# Patient Record
Sex: Male | Born: 1960 | Race: White | Hispanic: No | Marital: Married | Smoking: Former smoker
Health system: Southern US, Community
[De-identification: ages and names within clinical notes are randomized; demographics above are authoritative.]

## PROBLEM LIST (undated history)

## (undated) DIAGNOSIS — K219 Gastro-esophageal reflux disease without esophagitis: Secondary | ICD-10-CM

## (undated) DIAGNOSIS — Z974 Presence of external hearing-aid: Secondary | ICD-10-CM

## (undated) HISTORY — DX: Presence of external hearing-aid: Z97.4

## (undated) HISTORY — PX: ACHILLES TENDON REPAIR: SUR1153

## (undated) HISTORY — DX: Gastro-esophageal reflux disease without esophagitis: K21.9

---

## 1996-02-04 HISTORY — PX: LIPOMA EXCISION: SHX5283

## 2013-02-03 HISTORY — PX: COLONOSCOPY: SHX174

## 2014-03-09 ENCOUNTER — Ambulatory Visit: Payer: Self-pay

## 2014-08-22 ENCOUNTER — Other Ambulatory Visit: Payer: Self-pay | Admitting: Otolaryngology

## 2014-08-22 DIAGNOSIS — H8092 Unspecified otosclerosis, left ear: Secondary | ICD-10-CM

## 2014-08-22 DIAGNOSIS — H905 Unspecified sensorineural hearing loss: Secondary | ICD-10-CM

## 2014-08-25 ENCOUNTER — Ambulatory Visit
Admission: RE | Admit: 2014-08-25 | Discharge: 2014-08-25 | Disposition: A | Discharge status: Home / Self Care | Payer: 59 | Source: Ambulatory Visit | Attending: Otolaryngology | Admitting: Otolaryngology

## 2014-08-25 DIAGNOSIS — H9192 Unspecified hearing loss, left ear: Secondary | ICD-10-CM | POA: Diagnosis present

## 2014-08-25 DIAGNOSIS — H8092 Unspecified otosclerosis, left ear: Secondary | ICD-10-CM

## 2014-08-25 DIAGNOSIS — H905 Unspecified sensorineural hearing loss: Secondary | ICD-10-CM

## 2014-08-25 MED ORDER — GADOBENATE DIMEGLUMINE 529 MG/ML IV SOLN
20.0000 mL | Freq: Once | INTRAVENOUS | Status: AC | PRN
  Administered 2014-08-25: 18 mL via INTRAVENOUS

## 2016-06-03 DIAGNOSIS — M5416 Radiculopathy, lumbar region: Secondary | ICD-10-CM | POA: Insufficient documentation

## 2016-11-26 ENCOUNTER — Encounter: Payer: Self-pay | Admitting: *Deleted

## 2016-12-04 ENCOUNTER — Encounter: Payer: Self-pay | Admitting: *Deleted

## 2016-12-09 ENCOUNTER — Ambulatory Visit: Payer: Managed Care, Other (non HMO) | Admitting: General Surgery

## 2016-12-09 ENCOUNTER — Encounter: Payer: Self-pay | Admitting: General Surgery

## 2016-12-09 VITALS — BP 134/88 | HR 78 | Resp 12 | Ht 68.0 in | Wt 185.0 lb

## 2016-12-09 DIAGNOSIS — D179 Benign lipomatous neoplasm, unspecified: Secondary | ICD-10-CM | POA: Diagnosis not present

## 2016-12-09 NOTE — Patient Instructions (Signed)
Lipoma A lipoma is a noncancerous (benign) tumor that is made up of fat cells. This is a very common type of soft-tissue growth. Lipomas are usually found under the skin (subcutaneous). They may occur in any tissue of the body that contains fat. Common areas for lipomas to appear include the back, shoulders, buttocks, and thighs. Lipomas grow slowly, and they are usually painless. Most lipomas do not cause problems and do not require treatment. What are the causes? The cause of this condition is not known. What increases the risk? This condition is more likely to develop in:  People who are 40-60 years old.  People who have a family history of lipomas.  What are the signs or symptoms? A lipoma usually appears as a small, round bump under the skin. It may feel soft or rubbery, but the firmness can vary. Most lipomas are not painful. However, a lipoma may become painful if it is located in an area where it pushes on nerves. How is this diagnosed? A lipoma can usually be diagnosed with a physical exam. You may also have tests to confirm the diagnosis and to rule out other conditions. Tests may include:  Imaging tests, such as a CT scan or MRI.  Removal of a tissue sample to be looked at under a microscope (biopsy).  How is this treated? Treatment is not needed for small lipomas that are not causing problems. If a lipoma continues to get bigger or it causes problems, removal is often the best option. Lipomas can also be removed to improve appearance. Removal of a lipoma is usually done with a surgery in which the fatty cells and the surrounding capsule are removed. Most often, a medicine that numbs the area (local anesthetic) is used for this procedure. Follow these instructions at home:  Keep all follow-up visits as directed by your health care provider. This is important. Contact a health care provider if:  Your lipoma becomes larger or hard.  Your lipoma becomes painful, red, or  increasingly swollen. These could be signs of infection or a more serious condition. This information is not intended to replace advice given to you by your health care provider. Make sure you discuss any questions you have with your health care provider. Document Released: 01/10/2002 Document Revised: 06/28/2015 Document Reviewed: 01/16/2014 Elsevier Interactive Patient Education  2018 Elsevier Inc.  

## 2016-12-09 NOTE — Progress Notes (Signed)
Patient ID: Jason Bowers, male   DOB: 12/29/1960, 56 y.o.   MRN: [id number]  Chief Complaint  Patient presents with  . Lipoma    HPI Jason Bowers is a 56 y.o. male here today for a evaualtion of lipomas. Patient noticed this area about 10 year now. He states if he hits the ones on his arm there is some pain. He states he had about 20 of them removed before.  The patient describes having a surgical procedure over 30 lipomas were removed when he was in his 66s. He reported being covered from head to toe with Tegaderm dressings. He has multiple lipomas, but only some that are symptomatic. HPI  Past Medical History:  Diagnosis Date  . GERD (gastroesophageal reflux disease)   . Wears hearing aid     Past Surgical History:  Procedure Laterality Date  . COLONOSCOPY  2015  . LIPOMA EXCISION  1998    No family history on file.  Social History Social History   Tobacco Use  . Smoking status: Former Smoker    Years: 18.00    Types: Cigarettes  . Smokeless tobacco: Never Used  Substance Use Topics  . Alcohol use: Not on file  . Drug use: No    No Known Allergies  Current Outpatient Medications  Medication Sig Dispense Refill  . acetaminophen (TYLENOL) 325 MG tablet Take 650 mg every 6 (six) hours as needed by mouth.    . gabapentin (NEURONTIN) 300 MG capsule   0  . omeprazole (PRILOSEC OTC) 20 MG tablet Take by mouth.     No current facility-administered medications for this visit.     Review of Systems Review of Systems  Constitutional: Negative.   Respiratory: Negative.   Cardiovascular: Negative.     Blood pressure 134/88, pulse 78, resp. rate 12, height 5\' 8"  (1.727 m), weight 185 lb (83.9 kg).  Physical Exam Physical Exam  Constitutional: He is oriented to person, place, and time. He appears well-developed and well-nourished.  Cardiovascular: Normal rate, regular rhythm and normal heart sounds.  Pulmonary/Chest: Effort normal and breath sounds normal.   Musculoskeletal:       Arms: Neurological: He is alert and oriented to person, place, and time.  Skin: Skin is warm and dry.         Assessment    Multiple lipomas.    Plan    Options for management including local anesthesia versus sedation in the hol reviewed.    Patient to return for multiple lipoma excision.  The patient is aware to call back for any questions or concerns.   HPI, Physical Exam, Assessment and Plan have been scribed under the direction and in the presence of Hervey Ard, MD.  Gaspar Cola, CMA  I have completed the exam and reviewed the above documentation for accuracy and completeness.  I agree with the above.  Haematologist has been used and any errors in dictation or transcription are unintentional.  Hervey Ard, M.D., F.A.C.S.   Jason Bowers 12/10/2016, 7:00 PM

## 2016-12-10 DIAGNOSIS — D179 Benign lipomatous neoplasm, unspecified: Secondary | ICD-10-CM

## 2016-12-10 HISTORY — DX: Benign lipomatous neoplasm, unspecified: D17.9

## 2016-12-29 ENCOUNTER — Encounter: Payer: Self-pay | Admitting: General Surgery

## 2016-12-29 ENCOUNTER — Ambulatory Visit: Payer: Managed Care, Other (non HMO) | Admitting: General Surgery

## 2016-12-29 VITALS — BP 124/78 | HR 87 | Resp 12 | Ht 67.0 in | Wt 185.0 lb

## 2016-12-29 DIAGNOSIS — D179 Benign lipomatous neoplasm, unspecified: Secondary | ICD-10-CM

## 2016-12-29 DIAGNOSIS — D2112 Benign neoplasm of connective and other soft tissue of left upper limb, including shoulder: Secondary | ICD-10-CM | POA: Diagnosis not present

## 2016-12-29 DIAGNOSIS — D2111 Benign neoplasm of connective and other soft tissue of right upper limb, including shoulder: Secondary | ICD-10-CM | POA: Diagnosis not present

## 2016-12-29 DIAGNOSIS — D214 Benign neoplasm of connective and other soft tissue of abdomen: Secondary | ICD-10-CM | POA: Diagnosis not present

## 2016-12-29 DIAGNOSIS — D213 Benign neoplasm of connective and other soft tissue of thorax: Secondary | ICD-10-CM | POA: Diagnosis not present

## 2016-12-29 NOTE — Patient Instructions (Addendum)
You may shower. You can remove the waterproof dressings in 3 days. Leave the steri strips in place they will fall of in one week.  Follow up in one week with the nurse.  We will call you with the results You may use over the counter pain medication for any discomfort. Use ice to any areas that are very sore or tender.

## 2016-12-29 NOTE — Progress Notes (Signed)
Patient ID: Jason Bowers, male   DOB: 09/14/1960, 56 y.o.   MRN: [id number]  Chief Complaint  Patient presents with  . Procedure    HPI Jason Bowers is a 56 y.o. male here for removal of multiple lipomas from his arms and sternal area and peri-umbilical area.    HPI  Past Medical History:  Diagnosis Date  . GERD (gastroesophageal reflux disease)   . Wears hearing aid     Past Surgical History:  Procedure Laterality Date  . COLONOSCOPY  2015  . LIPOMA EXCISION  1998    No family history on file.  Social History Social History   Tobacco Use  . Smoking status: Former Smoker    Years: 18.00    Types: Cigarettes  . Smokeless tobacco: Never Used  Substance Use Topics  . Alcohol use: Not on file  . Drug use: No    No Known Allergies  Current Outpatient Medications  Medication Sig Dispense Refill  . acetaminophen (TYLENOL) 325 MG tablet Take 650 mg every 6 (six) hours as needed by mouth.    . gabapentin (NEURONTIN) 300 MG capsule   0  . omeprazole (PRILOSEC OTC) 20 MG tablet Take by mouth.     No current facility-administered medications for this visit.     Review of Systems Review of Systems  Blood pressure 124/78, pulse 87, resp. rate 12, height 5\' 7"  (1.702 m), weight 185 lb (83.9 kg).  Physical Exam Physical Exam  Skin:        Assessment    Multiple soft tissue masses consistent with lipomas.    Plan  The sites were cleansed with ChloraPrep. A total of 31 mL of 0.5% Xylocaine with 0.25% Marcaine with 1-200,000 epinephrine was utilized well tolerated. Beginning at the umbilicus the 6 cm lesion was excised through a transverse incision.A multilobulated lipoma was noted.a deep layer of running 3-0 Vicryl suture was used to help obliterate the dead space followed by a running 3-0 Vicryl subcuticular suture.  The sternal lesion was approached next through a transverse incision. A similar, 67 m multilobulated mass was excised. Layered closure with 3-0  Vicryl to the adipose layer and a running 3-0 Vicryl subcuticular suture for the skin.  The right upper arm lesion was approached through a longitudinal incision. This was closed primarily with a running 3-0 Vicryl subcuticular suture.   The left upper arm and left forearm lesions were resected through longitudinal incisions. Primary closure with running 3-Vicryl for both sites. These 2 lesions in the same vial. All lesions appeared to represent simple lipomas.  Steri-Strips, Telfa and Tegaderm dressings applied.  Patient reported no need for narcotic analgesics.  Nursing follow-up in one week.    We will call the patient with the results. To follow up with the nurse in one week.        Robert Bellow 12/29/2016, 9:28 PM

## 2016-12-30 ENCOUNTER — Ambulatory Visit (INDEPENDENT_AMBULATORY_CARE_PROVIDER_SITE_OTHER): Payer: Managed Care, Other (non HMO) | Admitting: *Deleted

## 2016-12-30 DIAGNOSIS — D179 Benign lipomatous neoplasm, unspecified: Secondary | ICD-10-CM

## 2016-12-30 NOTE — Progress Notes (Signed)
Patient came in today for a wound check.  Changed bandages, no signs of infection noted. Follow up as scheduled.

## 2017-01-05 ENCOUNTER — Ambulatory Visit: Payer: Managed Care, Other (non HMO)

## 2017-01-05 DIAGNOSIS — D179 Benign lipomatous neoplasm, unspecified: Secondary | ICD-10-CM

## 2017-01-05 NOTE — Progress Notes (Signed)
Patient ID: Jason Bowers, male   DOB: [birth year], 56 y.o.   MRN: [id number] Patient came in today for a wound check.  The wound is clean, with no signs of infection noted. Follow up as needed.

## 2017-01-06 ENCOUNTER — Telehealth: Payer: Self-pay | Admitting: *Deleted

## 2017-01-06 NOTE — Telephone Encounter (Signed)
Notified patient as instructed, patient agrees. Pathology also sent to patients PCP

## 2017-01-06 NOTE — Telephone Encounter (Signed)
-----   Message from Robert Bellow, MD sent at 01/06/2017  9:12 AM EST ----- Please notify the patient all tissue was examined and was just fat as expected.  ----- Message ----- From: Interface, Lab In [facility] Sent: 01/01/2017   5:57 PM To: Robert Bellow, MD

## 2018-06-16 ENCOUNTER — Other Ambulatory Visit: Payer: Self-pay | Admitting: Family Medicine

## 2018-06-16 DIAGNOSIS — G8929 Other chronic pain: Secondary | ICD-10-CM

## 2018-06-29 ENCOUNTER — Other Ambulatory Visit: Payer: Self-pay

## 2018-06-29 ENCOUNTER — Ambulatory Visit
Admission: RE | Admit: 2018-06-29 | Discharge: 2018-06-29 | Disposition: A | Discharge status: Home / Self Care | Payer: BLUE CROSS/BLUE SHIELD | Source: Ambulatory Visit | Attending: Family Medicine | Admitting: Family Medicine

## 2018-06-29 DIAGNOSIS — G8929 Other chronic pain: Secondary | ICD-10-CM | POA: Insufficient documentation

## 2018-06-29 DIAGNOSIS — M5442 Lumbago with sciatica, left side: Secondary | ICD-10-CM | POA: Diagnosis present

## 2018-08-03 DIAGNOSIS — E7800 Pure hypercholesterolemia, unspecified: Secondary | ICD-10-CM

## 2018-08-03 HISTORY — DX: Pure hypercholesterolemia, unspecified: E78.00

## 2018-12-16 ENCOUNTER — Other Ambulatory Visit
Admission: RE | Admit: 2018-12-16 | Payer: BLUE CROSS/BLUE SHIELD | Source: Ambulatory Visit | Attending: Gastroenterology | Admitting: Gastroenterology

## 2018-12-20 ENCOUNTER — Ambulatory Visit
Admission: RE | Admit: 2018-12-20 | Payer: BLUE CROSS/BLUE SHIELD | Source: Home / Self Care | Admitting: Internal Medicine

## 2018-12-20 ENCOUNTER — Encounter: Admission: RE | Payer: Self-pay | Source: Home / Self Care

## 2018-12-20 SURGERY — COLONOSCOPY WITH PROPOFOL
Anesthesia: General

## 2019-05-26 ENCOUNTER — Other Ambulatory Visit: Payer: Self-pay | Admitting: Family Medicine

## 2019-05-26 DIAGNOSIS — M25552 Pain in left hip: Secondary | ICD-10-CM

## 2019-05-26 DIAGNOSIS — M79605 Pain in left leg: Secondary | ICD-10-CM

## 2019-05-26 DIAGNOSIS — M5417 Radiculopathy, lumbosacral region: Secondary | ICD-10-CM

## 2019-05-26 DIAGNOSIS — R202 Paresthesia of skin: Secondary | ICD-10-CM

## 2019-05-26 DIAGNOSIS — M533 Sacrococcygeal disorders, not elsewhere classified: Secondary | ICD-10-CM

## 2019-06-01 ENCOUNTER — Ambulatory Visit
Admission: RE | Admit: 2019-06-01 | Discharge: 2019-06-01 | Disposition: A | Discharge status: Home / Self Care | Payer: BLUE CROSS/BLUE SHIELD | Source: Ambulatory Visit | Attending: Family Medicine | Admitting: Family Medicine

## 2019-06-01 ENCOUNTER — Other Ambulatory Visit: Payer: Self-pay

## 2019-06-01 DIAGNOSIS — M25552 Pain in left hip: Secondary | ICD-10-CM | POA: Insufficient documentation

## 2019-06-01 DIAGNOSIS — R202 Paresthesia of skin: Secondary | ICD-10-CM | POA: Diagnosis present

## 2019-06-01 DIAGNOSIS — M79605 Pain in left leg: Secondary | ICD-10-CM

## 2019-06-01 DIAGNOSIS — M533 Sacrococcygeal disorders, not elsewhere classified: Secondary | ICD-10-CM

## 2019-06-01 DIAGNOSIS — M5417 Radiculopathy, lumbosacral region: Secondary | ICD-10-CM

## 2019-06-08 ENCOUNTER — Other Ambulatory Visit: Payer: BLUE CROSS/BLUE SHIELD

## 2019-06-08 ENCOUNTER — Ambulatory Visit: Payer: BLUE CROSS/BLUE SHIELD

## 2020-01-09 ENCOUNTER — Other Ambulatory Visit: Payer: Self-pay | Admitting: Family Medicine

## 2020-01-09 DIAGNOSIS — J3489 Other specified disorders of nose and nasal sinuses: Secondary | ICD-10-CM

## 2020-01-17 ENCOUNTER — Other Ambulatory Visit: Payer: Self-pay

## 2020-01-17 ENCOUNTER — Ambulatory Visit
Admission: RE | Admit: 2020-01-17 | Discharge: 2020-01-17 | Disposition: A | Discharge status: Home / Self Care | Payer: BLUE CROSS/BLUE SHIELD | Source: Ambulatory Visit | Attending: Family Medicine | Admitting: Family Medicine

## 2020-01-17 DIAGNOSIS — J3489 Other specified disorders of nose and nasal sinuses: Secondary | ICD-10-CM | POA: Insufficient documentation

## 2020-07-03 ENCOUNTER — Ambulatory Visit (INDEPENDENT_AMBULATORY_CARE_PROVIDER_SITE_OTHER): Payer: BLUE CROSS/BLUE SHIELD | Admitting: Urology

## 2020-07-03 ENCOUNTER — Encounter: Payer: Self-pay | Admitting: Urology

## 2020-07-03 ENCOUNTER — Other Ambulatory Visit: Payer: Self-pay

## 2020-07-03 VITALS — BP 145/86 | HR 86 | Ht 67.0 in | Wt 186.0 lb

## 2020-07-03 DIAGNOSIS — N401 Enlarged prostate with lower urinary tract symptoms: Secondary | ICD-10-CM

## 2020-07-03 DIAGNOSIS — R339 Retention of urine, unspecified: Secondary | ICD-10-CM | POA: Diagnosis not present

## 2020-07-03 DIAGNOSIS — Z125 Encounter for screening for malignant neoplasm of prostate: Secondary | ICD-10-CM

## 2020-07-03 DIAGNOSIS — R3912 Poor urinary stream: Secondary | ICD-10-CM | POA: Diagnosis not present

## 2020-07-03 DIAGNOSIS — Z8619 Personal history of other infectious and parasitic diseases: Secondary | ICD-10-CM

## 2020-07-03 DIAGNOSIS — N400 Enlarged prostate without lower urinary tract symptoms: Secondary | ICD-10-CM

## 2020-07-03 DIAGNOSIS — K219 Gastro-esophageal reflux disease without esophagitis: Secondary | ICD-10-CM | POA: Insufficient documentation

## 2020-07-03 HISTORY — DX: Personal history of other infectious and parasitic diseases: Z86.19

## 2020-07-03 LAB — BLADDER SCAN AMB NON-IMAGING

## 2020-07-03 NOTE — Progress Notes (Signed)
07/03/2020 3:37 PM   Jason Bowers 10/08/1960 [id number]  Referring provider: Dion Body, MD [facility] [place],  [place] [address]  Chief Complaint  Patient presents with  . Benign Prostatic Hypertrophy    New Patient    HPI: 60 year old male with progressive urinary symptoms over the past several years who presents today for further evaluation of this.  He primarily reports weak stream especially when his bladder is full, difficulty emptying his bladder, and having to strain with urination his primary symptoms.  This been going on for years but he is intermittent worsening of his urinary symptoms here and there.    He is been checked for UTI on several occasions and found to have none.  He denies a history of overt urinary retention, hematuria or UTIs.  He has not had a PSA checked since 2016, 1.21.  He reports that his father had prostate issues but never had surgery or prostate cancer.  Notably, he was started on Flomax about 2 weeks ago at the time of this referral.  He reports a dramatic improvement in his stream, ease of emptying, and sensation of complete emptying.  He is very pleased with this medication.  He is not having any side effects from this medication.  Results for orders placed or performed in visit on 07/03/20  BLADDER SCAN AMB NON-IMAGING  Result Value Ref Range   Scan Result 187ml       IPSS    Row Name 07/03/20 1500         International Prostate Symptom Score   How often have you had the sensation of not emptying your bladder? Less than half the time     How often have you had to urinate less than every two hours? Less than 1 in 5 times     How often have you found you stopped and started again several times when you urinated? Less than half the time     How often have you found it difficult to postpone urination? Less than half the time     How often have you had a weak urinary stream? Less than half the  time     How often have you had to strain to start urination? Less than 1 in 5 times     How many times did you typically get up at night to urinate? 2 Times     Total IPSS Score 12           Quality of Life due to urinary symptoms   If you were to spend the rest of your life with your urinary condition just the way it is now how would you feel about that? Pleased            Score:  1-7 Mild 8-19 Moderate 20-35 Severe    PMH: Past Medical History:  Diagnosis Date  . GERD (gastroesophageal reflux disease)   . 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 Multiple lipomas 12/10/2016  . Pure hypercholesterolemia 08/03/2018  . Wears hearing aid     Surgical History: Past Surgical History:  Procedure Laterality Date  . ACHILLES TENDON REPAIR Left   . COLONOSCOPY  2015  . LIPOMA EXCISION  1998    Home Medications:  Allergies as of 07/03/2020   No Known Allergies     Medication List       Accurate as of Jul 03, 2020  3:37 PM. If you have any questions,  ask your nurse or doctor.        acetaminophen 325 MG tablet Commonly known as: TYLENOL Take 650 mg every 6 (six) hours as needed by mouth.   gabapentin 300 MG capsule Commonly known as: NEURONTIN   omeprazole 20 MG tablet Commonly known as: PRILOSEC OTC Take by mouth.   tamsulosin 0.4 MG Caps capsule Commonly known as: FLOMAX Take 0.4 mg by mouth daily.       Allergies: No Known Allergies  Family History: Family History  Problem Relation Age of Onset  . Bladder Cancer Mother   . Kidney cancer Neg Hx   . Prostate cancer Neg Hx     Social History:  reports that he has quit smoking. His smoking use included cigarettes. He quit after 18.00 years of use. He has never used smokeless tobacco. He reports that he does not use drugs. No history on file for alcohol use.   Physical Exam: BP (!) 145/86   Pulse 86   Ht 5\' 7"  (1.702 m)   Wt 186 lb (84.4 kg)   BMI 29.13 kg/m   Constitutional:   Alert and oriented, No acute distress. HEENT: [place] AT, moist mucus membranes.  Trachea midline, no masses. Cardiovascular: No clubbing, cyanosis, or edema. Respiratory: Normal respiratory effort, no increased work of breathing. GI: Abdomen is soft, nontender, nondistended, no abdominal masses GU: No CVA tenderness Rectal: Normal sphincter tone.  Rubbery bilateral prostate lobes without nodularity, 50+ cc. Skin: No rashes, bruises or suspicious lesions. Neurologic: Grossly intact, no focal deficits, moving all 4 extremities. Psychiatric: Normal mood and affect.  Laboratory Data: PSA as above,  Creatinine within normal limits  Urinalysis Negative today, see epic    Assessment & Plan:    1. Benign prostatic hyperplasia with weak urinary stream Primarily obstructive progressive urinary symptoms now improved on Flomax  We discussed various management options for probable underlying BPH with incomplete bladder emptying including optimization of pharmacotherapy versus procedural/surgical intervention.  We discussed the concept of bladder preservation and risk and benefits of each modality.  For the time being, he like to continue Flomax and reassess his urinary symptoms.  If his symptoms progress or he changes his mind about the medication, he may pursue intervention.  This would necessitate cystoscopy/transrectal ultrasound sizing.  He understands.  Reassess urinary symptoms with IPSS/PVR in 6 months  Continue Flomax 0.4 mg - BLADDER SCAN AMB NON-IMAGING - Urinalysis, Complete - PSA; Future - PSA  2. Incomplete bladder emptying Mildly elevated PVR today 268 cc, suspect it may have been more markedly elevated prior to initiation of Flomax  We will continue to monitor  3. Screening PSA (prostate specific antigen) We discussed the risk and benefits of prostate cancer screening along with the [place] preventative task force versus the AUA guidelines  Given his progressive obstructive  urinary symptoms, would strongly recommend pursuing this at this time to rule out prostate cancer especially as an underlying cause  He is agreeable this plan, PSA was obtained today and rectal exam was unremarkable other than for prostamegaly   F/u 6 months IPSS/PVR  Hollice Espy, MD  [facility] [address] [place], [place] [address] [phone]

## 2020-07-04 ENCOUNTER — Telehealth: Payer: Self-pay | Admitting: *Deleted

## 2020-07-04 LAB — URINALYSIS, COMPLETE
Bilirubin, UA: NEGATIVE
Glucose, UA: NEGATIVE
Ketones, UA: NEGATIVE
Leukocytes,UA: NEGATIVE
Nitrite, UA: NEGATIVE
Protein,UA: NEGATIVE
RBC, UA: NEGATIVE
Specific Gravity, UA: 1.01 (ref 1.005–1.030)
Urobilinogen, Ur: 0.2 mg/dL (ref 0.2–1.0)
pH, UA: 5.5 (ref 5.0–7.5)

## 2020-07-04 LAB — MICROSCOPIC EXAMINATION
Bacteria, UA: NONE SEEN
Epithelial Cells (non renal): NONE SEEN /hpf (ref 0–10)
RBC: NONE SEEN /hpf (ref 0–2)
WBC, UA: NONE SEEN /hpf (ref 0–5)

## 2020-07-04 LAB — PSA: Prostate Specific Ag, Serum: 3 ng/mL (ref 0.0–4.0)

## 2020-07-04 NOTE — Telephone Encounter (Addendum)
Patient informed, voiced understanding.   ----- Message from Hollice Espy, MD sent at 07/04/2020  8:13 AM EDT ----- PSA is within the normal range.  Hollice Espy, MD

## 2020-07-23 ENCOUNTER — Other Ambulatory Visit: Payer: Self-pay | Admitting: Physical Medicine and Rehabilitation

## 2020-07-23 DIAGNOSIS — M5412 Radiculopathy, cervical region: Secondary | ICD-10-CM

## 2020-07-31 ENCOUNTER — Ambulatory Visit: Admission: RE | Admit: 2020-07-31 | Payer: BLUE CROSS/BLUE SHIELD | Source: Ambulatory Visit

## 2020-08-10 ENCOUNTER — Ambulatory Visit: Admission: RE | Admit: 2020-08-10 | Payer: BLUE CROSS/BLUE SHIELD | Source: Ambulatory Visit

## 2020-08-17 ENCOUNTER — Other Ambulatory Visit: Payer: Self-pay

## 2020-08-17 ENCOUNTER — Ambulatory Visit
Admission: RE | Admit: 2020-08-17 | Discharge: 2020-08-17 | Disposition: A | Discharge status: Home / Self Care | Payer: BLUE CROSS/BLUE SHIELD | Source: Ambulatory Visit | Attending: Physical Medicine and Rehabilitation | Admitting: Physical Medicine and Rehabilitation

## 2020-08-17 DIAGNOSIS — M5412 Radiculopathy, cervical region: Secondary | ICD-10-CM | POA: Insufficient documentation

## 2021-01-06 NOTE — Progress Notes (Signed)
01/08/21 11:29 AM   Jason Bowers 08/18/1960 [id number]  Referring provider:  Dion Body, MD [facility] [place],  [place] [address] Chief Complaint  Patient presents with   Benign Prostatic Hypertrophy    HPI: Jason Bowers is a 60 y.o.male with a personal history of BPH with weak urinary stream and incomplete bladder emptying, who presents today for a 6 month follow-up with IPSS, PSA, and PVR.   His most recent PSA as of 07/03/2020 was 3.0.  This was repeated yesterday and is now up to 3.8.  In 2016, his PSA was 1.12.  He reports that his bladder has felt like it has been emptying adequately.  He feels like the Flomax is really helped his urinary symptoms.  Initially today, his PVR was in the mid 250s.  He was asked to try to void again and PVR lower as below.  He does feel it is emptying for the most part at home.  He has no urinary complaints today.  IPSS as below.   IPSS     Row Name 01/08/21 1100         International Prostate Symptom Score   How often have you had the sensation of not emptying your bladder? Less than 1 in 5     How often have you had to urinate less than every two hours? Not at All     How often have you found you stopped and started again several times when you urinated? Less than 1 in 5 times     How often have you found it difficult to postpone urination? Not at All     How often have you had a weak urinary stream? Less than half the time     How often have you had to strain to start urination? Less than 1 in 5 times     How many times did you typically get up at night to urinate? 1 Time     Total IPSS Score 6       Quality of Life due to urinary symptoms   If you were to spend the rest of your life with your urinary condition just the way it is now how would you feel about that? 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 If you have any questions, ask your nurse or doctor.          acetaminophen 325 MG tablet Commonly known as: TYLENOL Take 650 mg every 6 (six) hours as needed by mouth.   gabapentin 300 MG capsule Commonly known as: NEURONTIN   omeprazole 20 MG tablet Commonly known as: PRILOSEC OTC Take by mouth.   tamsulosin 0.4 MG Caps capsule Commonly known as: FLOMAX Take 0.4 mg by mouth daily.  Allergies: No Known Allergies  Family History: Family History  Problem Relation Age of Onset   Bladder Cancer Mother    Kidney cancer Neg Hx    Prostate cancer Neg Hx     Social History:  reports that he has quit smoking. His smoking use included cigarettes. He has never used smokeless tobacco. He reports that he does not use drugs. No history on file for alcohol use.   Physical Exam: BP (!) 142/82   Pulse 80   Ht 5\' 7"  (1.702 m)   Wt 186 lb (84.4 kg)   BMI 29.13 kg/m   Constitutional:  Alert and oriented, No acute distress. HEENT: [place] AT, moist mucus membranes.  Trachea midline, no masses. Cardiovascular: No clubbing, cyanosis, or edema. Respiratory: Normal respiratory effort, no increased work of breathing. Skin: No rashes, bruises or suspicious lesions. Neurologic: Grossly intact, no focal deficits, moving all 4 extremities. Psychiatric: Normal mood and affect.  Pertinent Imaging: Results for orders placed or performed in visit on 01/08/21  Bladder Scan (Post Void Residual) in office  Result  Value Ref Range   Scan Result 115     Assessment & Plan:    1. Benign prostatic hyperplasia with weak urinary stream - Initially PVR in the mid 200 range, he was asked to void again with PVR of 115 ml - He has seen improvement on Flomax  - Continue Flomax 0.4 mg -Not interested in outlet procedure at this time -We will continue to monitor PVRs has been borderline - BLADDER SCAN AMB NON-IMAGING  2. Elevated/Rising PSA  - We discussed the risk and benefits of prostate cancer screening along with the [place] preventative task force versus the AUA guidelines - His PSA has gradually elevated. We will continue to monitor his PSA. If it is still rising will recommend prostate biopsy to rule out any pathology - PSA; Future 3 month     Return in 3 months for PSA and in 1 year with IPSS, PVR, PSA, and DRE  I,Kailey Littlejohn,acting as a scribe for Hollice Espy, MD.,have documented all relevant documentation on the behalf of Hollice Espy, MD,as directed by  Hollice Espy, MD while in the presence of Hollice Espy, MD.  I have reviewed the above documentation for accuracy and completeness, and I agree with the above.   Hollice Espy, MD   [facility] [address], [place], [place] [address] [phone]

## 2021-01-07 ENCOUNTER — Other Ambulatory Visit: Payer: Self-pay

## 2021-01-07 ENCOUNTER — Other Ambulatory Visit: Payer: BLUE CROSS/BLUE SHIELD

## 2021-01-07 DIAGNOSIS — Z125 Encounter for screening for malignant neoplasm of prostate: Secondary | ICD-10-CM

## 2021-01-08 ENCOUNTER — Ambulatory Visit (INDEPENDENT_AMBULATORY_CARE_PROVIDER_SITE_OTHER): Payer: BLUE CROSS/BLUE SHIELD | Admitting: Urology

## 2021-01-08 ENCOUNTER — Encounter: Payer: Self-pay | Admitting: Urology

## 2021-01-08 VITALS — BP 142/82 | HR 80 | Ht 67.0 in | Wt 186.0 lb

## 2021-01-08 DIAGNOSIS — N401 Enlarged prostate with lower urinary tract symptoms: Secondary | ICD-10-CM

## 2021-01-08 DIAGNOSIS — R3912 Poor urinary stream: Secondary | ICD-10-CM

## 2021-01-08 LAB — BLADDER SCAN AMB NON-IMAGING: Scan Result: 115

## 2021-01-08 LAB — PSA: Prostate Specific Ag, Serum: 3.8 ng/mL (ref 0.0–4.0)

## 2021-04-06 IMAGING — MR MR LUMBAR SPINE W/O CM
5 series · 30 of 48 positions shown · non-contrast
Comparison: Radiographs dated 03/07/2014 and lumbar MRI dated
06/29/2018

CLINICAL DATA: Chronic progressive low back pain, asymmetric to the
left. Left leg paresthesias in left leg and hip pain. Radiculopathy
of the left lower extremity. Left sacroiliac joint pain.

EXAM:
MRI LUMBAR SPINE WITHOUT CONTRAST
TECHNIQUE: Multiplanar, multisequence MR imaging of the lumbar spine was
performed. No intravenous contrast was administered.

[Series 5: T2 · sagittal · 4.0mm · 0.88mm/px · 6 of 17 slices shown (1 of 2)]
[im 1/17]
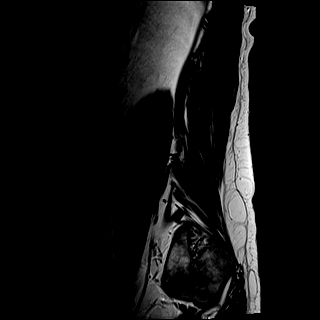
[im 4/17]
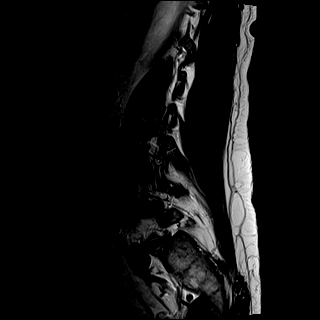
[im 7/17]
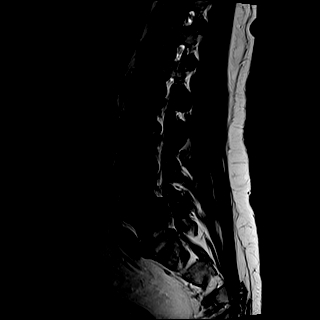
[im 10/17]
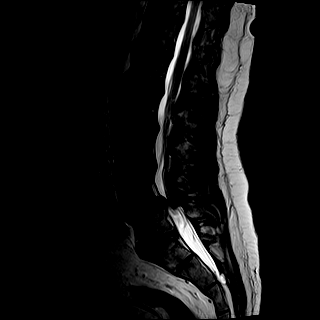
[im 13/17]
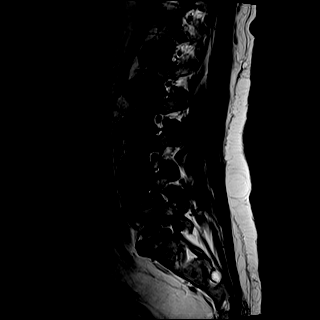
[im 17/17]
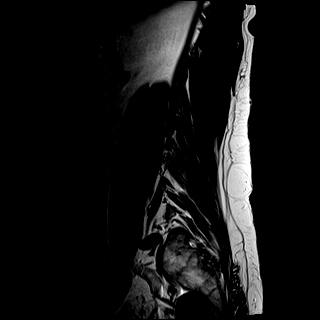

[Series 6: T1 · sagittal · 4.0mm · 0.88mm/px · 7 of 17 slices shown (1 of 2)]
[im 1/17]
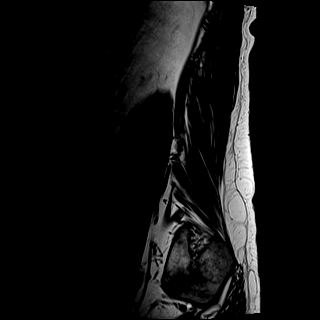
[im 3/17]
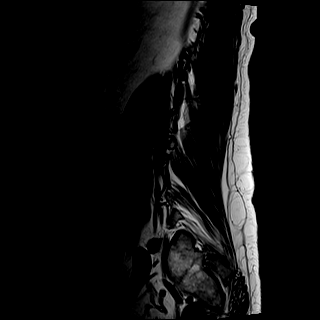
[im 6/17]
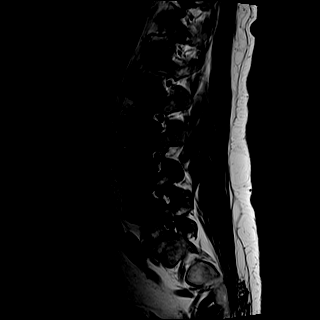
[im 9/17]
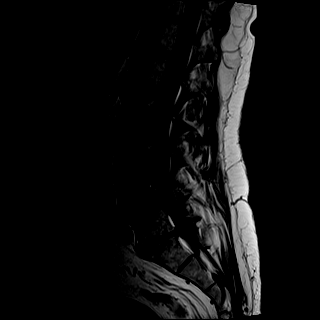
[im 11/17]
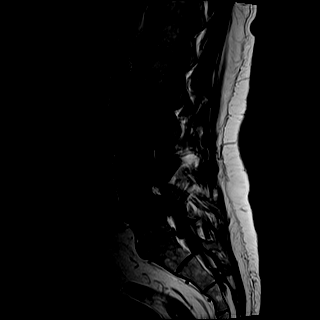
[im 14/17]
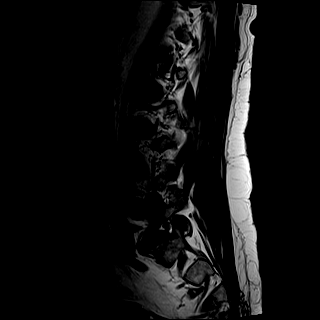
[im 17/17]
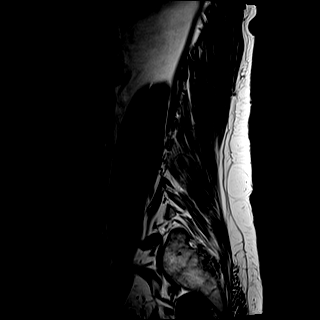

[Series 7: STIR · sagittal · 4.0mm · 0.44mm/px · 1 of 17 slices shown]
[im 1/17]
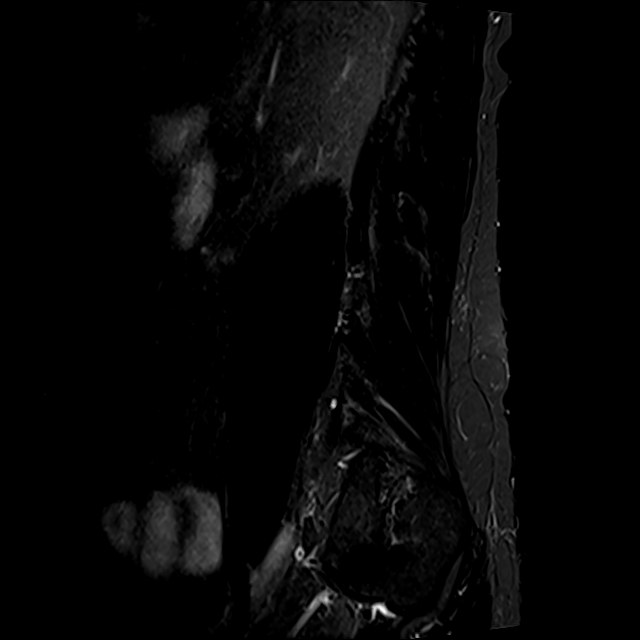

[Series 8: T2 · axial · 4.0mm · 0.78mm/px · z∈[-122,+95]mm · 8 of 36 slices shown (2 of 2)]
[im 1/36]
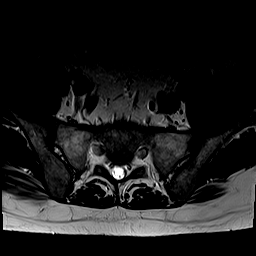
[im 6/36]
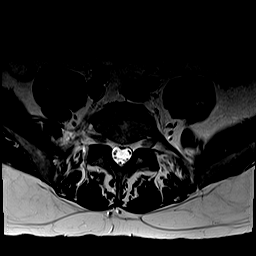
[im 11/36]
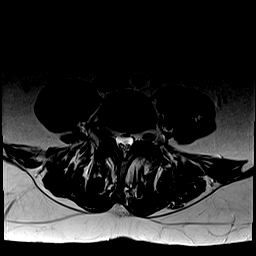
[im 17/36]
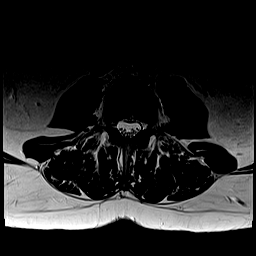
[im 19/36]
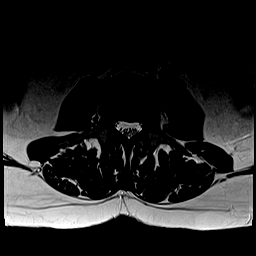
[im 25/36]
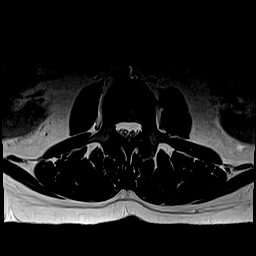
[im 30/36]
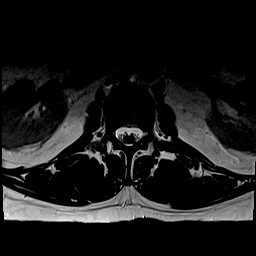
[im 36/36]
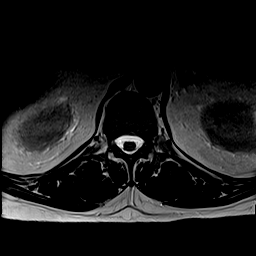

[Series 9: T1 · axial · 4.0mm · 0.39mm/px · z∈[-122,+95]mm · 8 of 36 slices shown (2 of 2)]
[im 1/36]
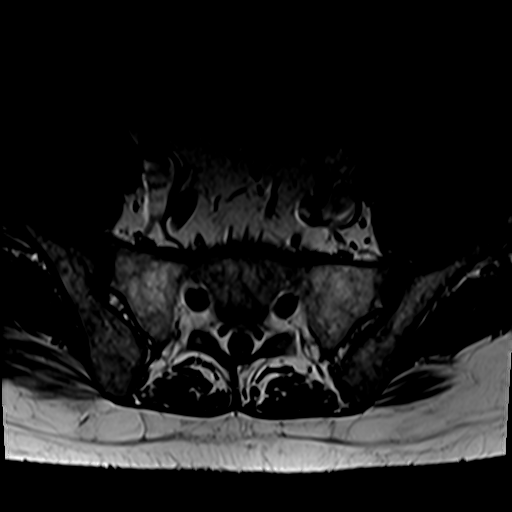
[im 6/36]
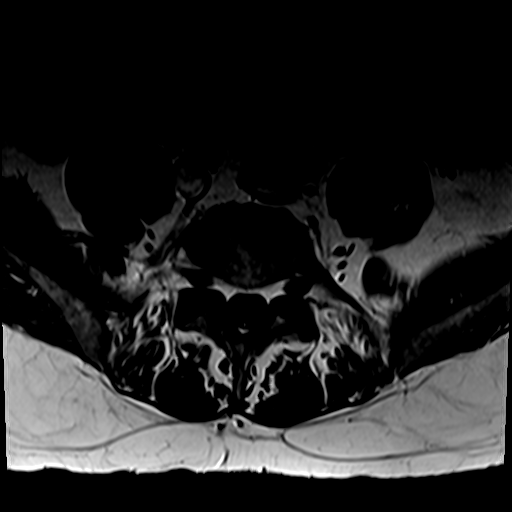
[im 11/36]
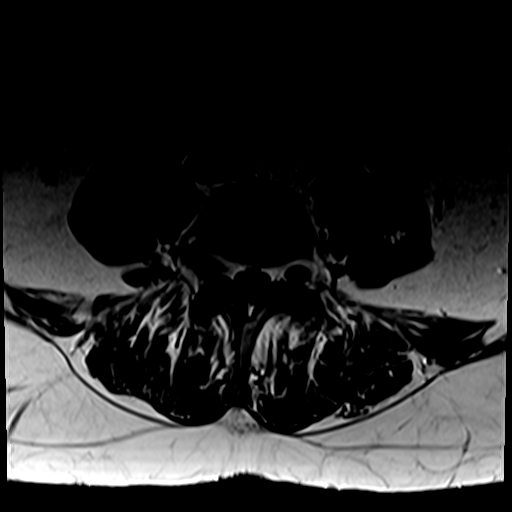
[im 17/36]
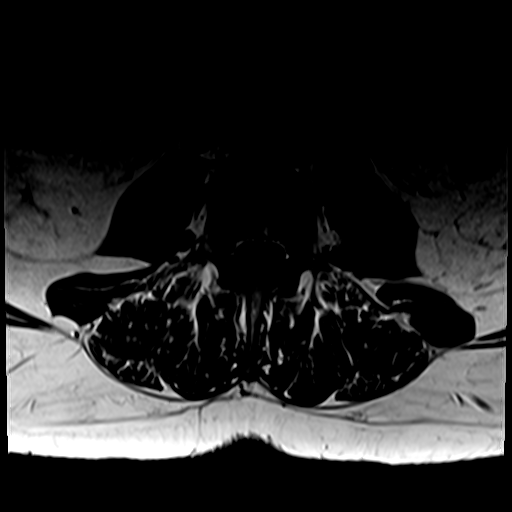
[im 19/36]
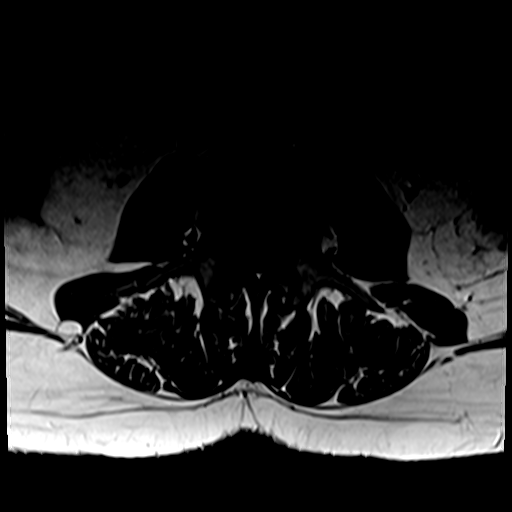
[im 25/36]
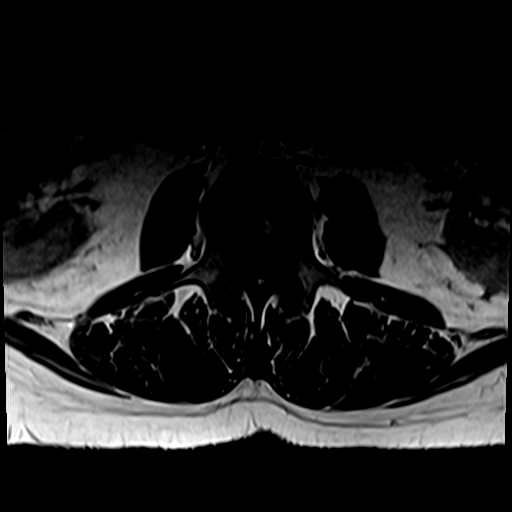
[im 30/36]
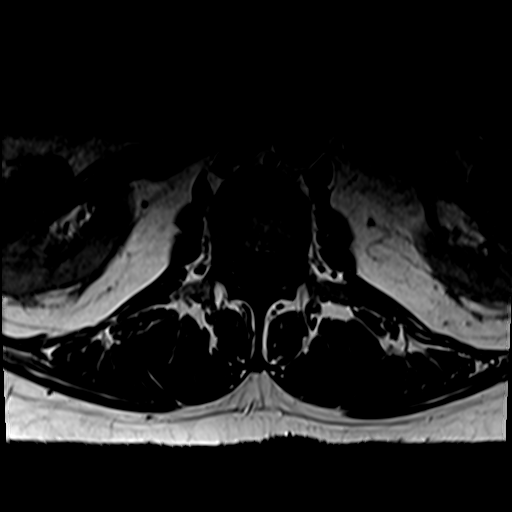
[im 36/36]
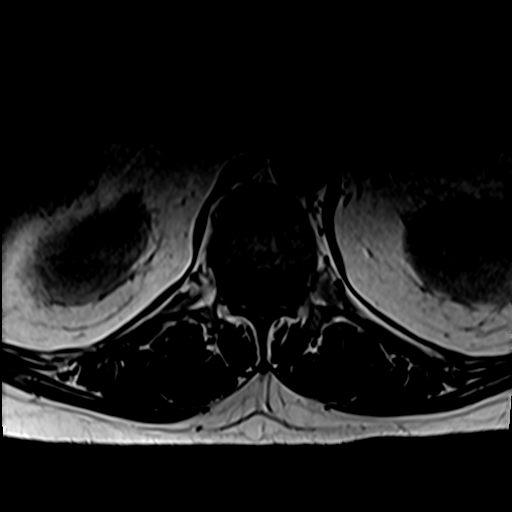

[30 of 48 positions shown; findings below may reference images not displayed]

FINDINGS: Segmentation:  5 lumbar type vertebra.

Alignment: Chronic 6 mm grade 1 spondylolisthesis at L4-5,
unchanged. Otherwise physiologic alignment.

Vertebrae:  No fracture, evidence of discitis, or bone lesion.

Conus medullaris and cauda equina: Conus extends to the L2 level.
Conus and cauda equina appear normal.

Paraspinal and other soft tissues: Negative.

Disc levels:

T12-L1: Normal.

L1-2: Disc desiccation. Tiny broad-based disc bulge with no neural
impingement, unchanged.

L2-3: Disc desiccation. Tiny broad-based disc bulge with a
progressed small protrusion lateral to the left neural foramen
immediately adjacent to the left L2 nerve lateral to the neural
foramen. This could irritate the left L2 nerve. This is best seen on
images 15 and 16 of series 9 and series 8. Slight hypertrophy of the
ligamentum flavum and facet joints, stable. No spinal stenosis.

L3-4: Normal.

L4-5: Chronic disc space narrowing with chronic 6 mm
spondylolisthesis. Broad-based bulge of the uncovered disc with an
extrusion into the right neural foramen, with a mass effect upon the
right L4 nerve lateral to the neural foramen. This appears
unchanged. Severe bilateral facet arthritis with severe compression
of both lateral recesses, left more than right which could affect
either or both L5 nerves, unchanged. Moderately severe spinal
stenosis, unchanged.

L5-S1: Chronic disc space narrowing. Minimal broad-based disc bulge
with accompanying osteophytes without neural impingement. No
significant foraminal stenosis. No facet arthritis.
IMPRESSION: 1. Chronic severe spinal stenosis and bilateral lateral recess
compression at L4-5, left greater than right which could affect
either or both L5 nerves, unchanged.
2. New soft disc protrusion lateral to the left neural foramen at
L2-3 which could irritate the left L2 nerve lateral to the neural
foramen.
3. Chronic moderately severe spinal stenosis at L4-5, unchanged.

## 2021-04-06 IMAGING — MR MR SACRUM / SI JOINTS WO CM
4 of 5 series · 24 of 48 positions shown · non-contrast
Comparison: None.

CLINICAL DATA: Left sacroiliac joint pain. Left-sided low back pain
and left lower extremity pain.

EXAM:
MRI SACRUM WITHOUT CONTRAST
TECHNIQUE: Multiplanar, multisequence MR imaging of the sacrum was performed.
No intravenous contrast was administered.

[Series 4: T1 · axial · 4.0mm · 0.43mm/px · z∈[-240,-136]mm · 9 of 25 slices shown (1 of 2)]
[im 1/25]
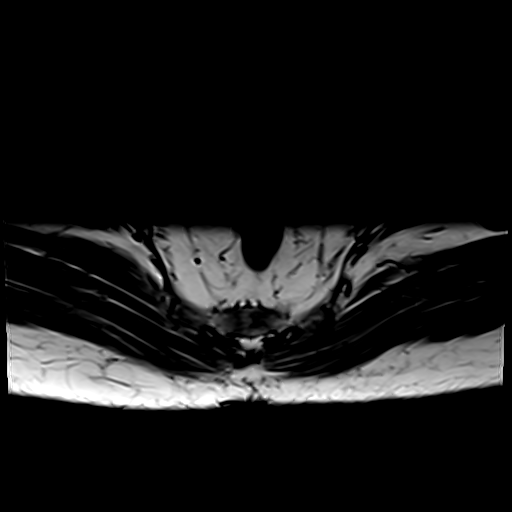
[im 4/25]
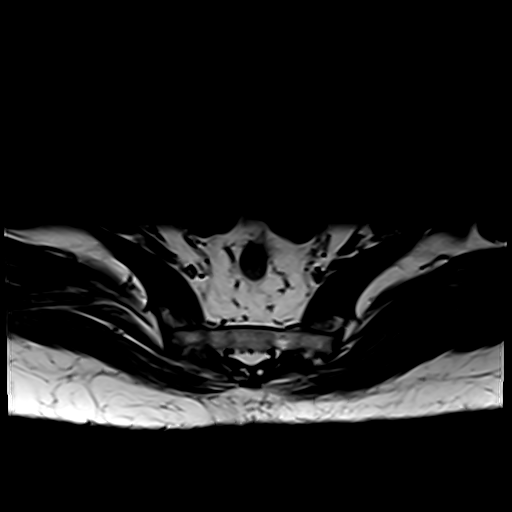
[im 7/25]
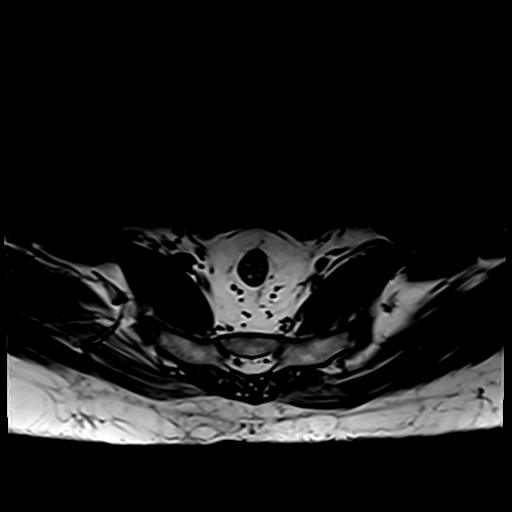
[im 10/25]
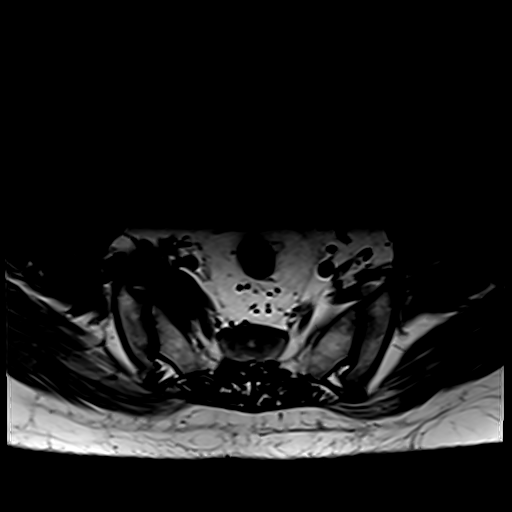
[im 13/25]
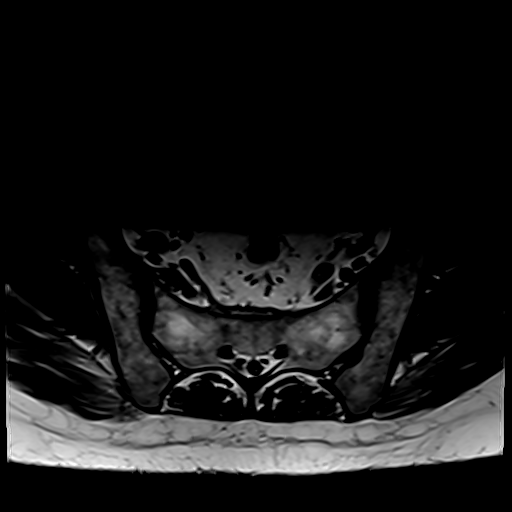
[im 16/25]
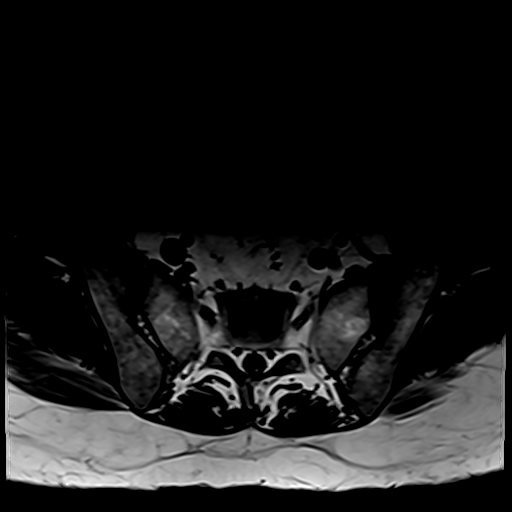
[im 19/25]
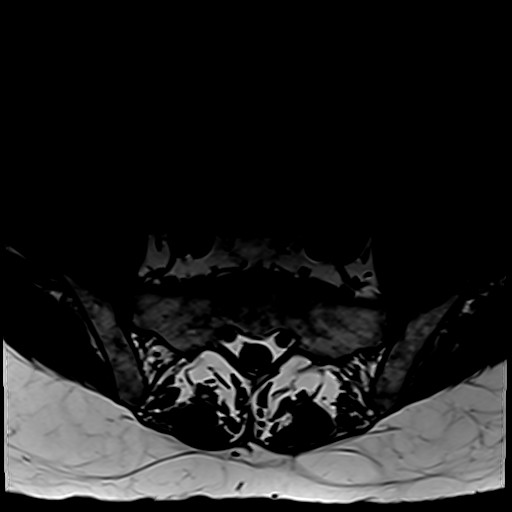
[im 22/25]
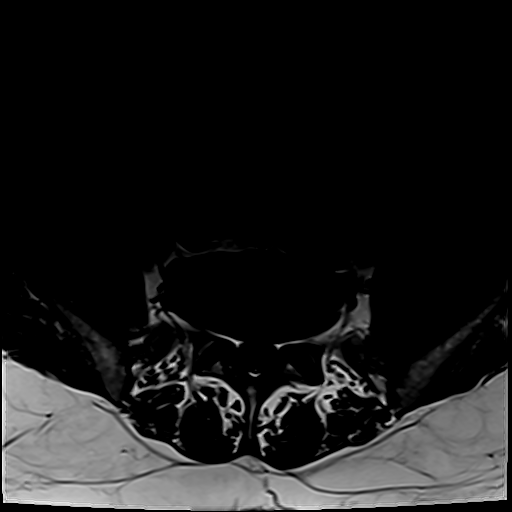
[im 25/25]
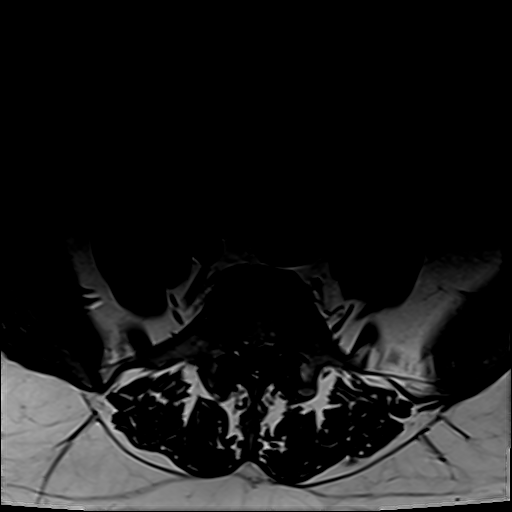

[Series 5: T2 fat-sat · axial · 4.0mm · 0.69mm/px · z∈[-240,-136]mm · 9 of 25 slices shown]
[im 1/25]
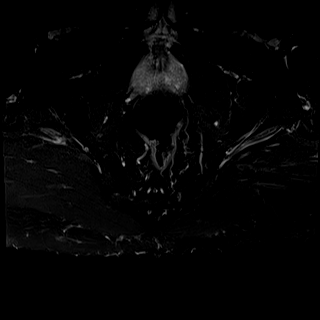
[im 4/25]
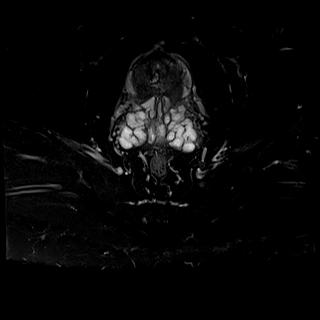
[im 7/25]
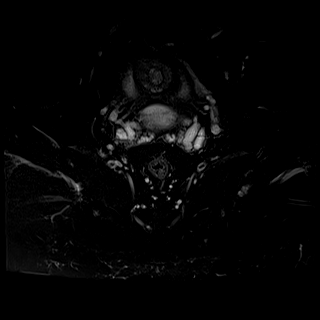
[im 10/25]
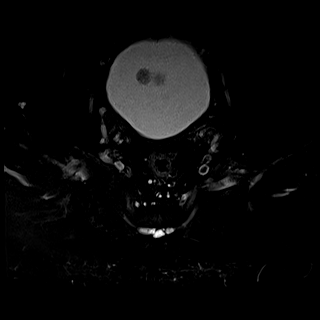
[im 13/25]
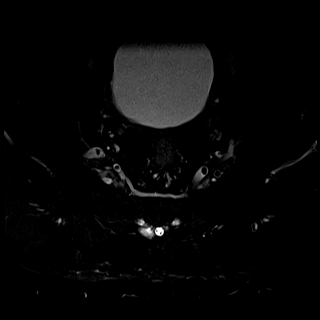
[im 16/25]
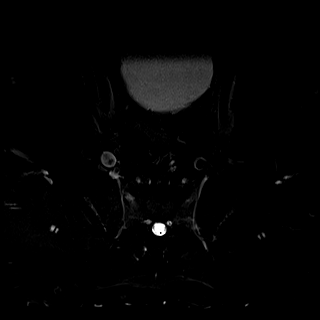
[im 19/25]
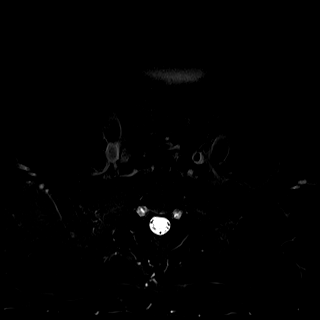
[im 22/25]
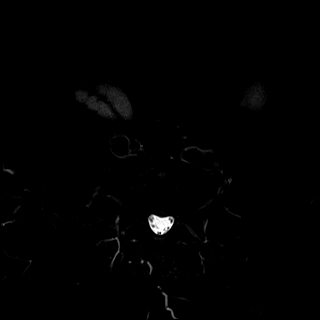
[im 25/25]
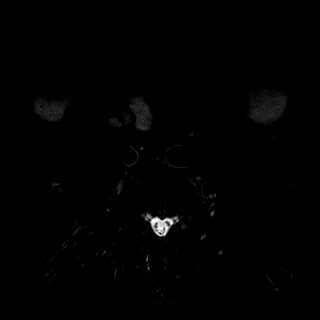

[Series 6: STIR · coronal · 3.0mm · 0.51mm/px · 3 of 25 slices shown]
[im 4/25]
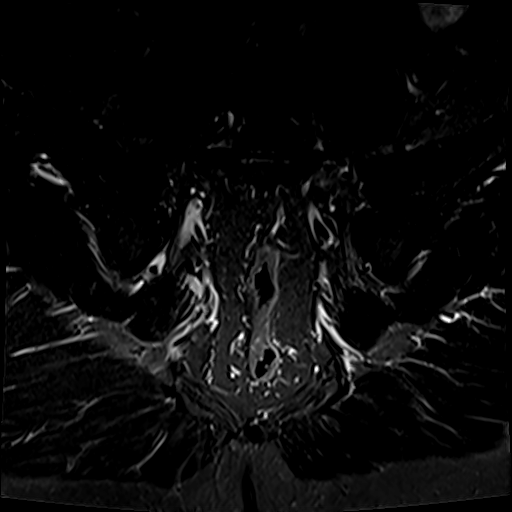
[im 13/25]
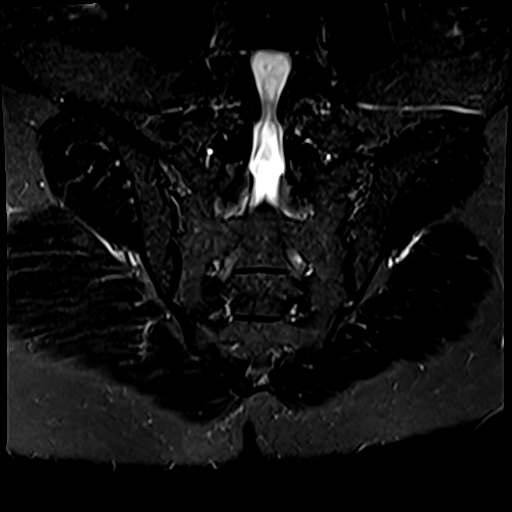
[im 22/25]
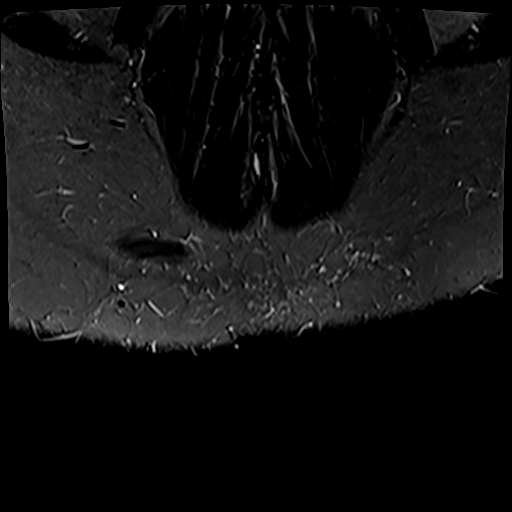

[Series 7: T1 · coronal · 3.0mm · 0.51mm/px · 3 of 25 slices shown (2 of 2)]
[im 4/25]
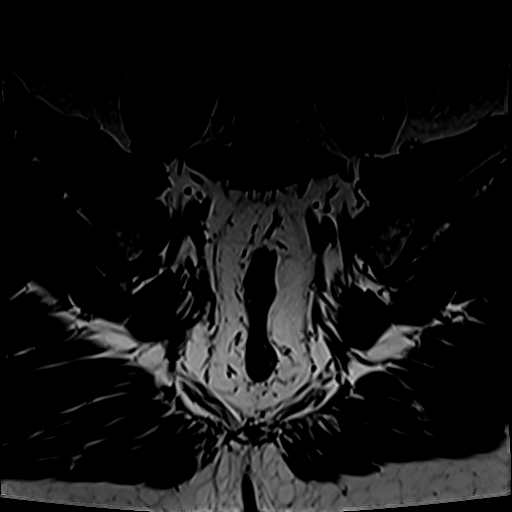
[im 13/25]
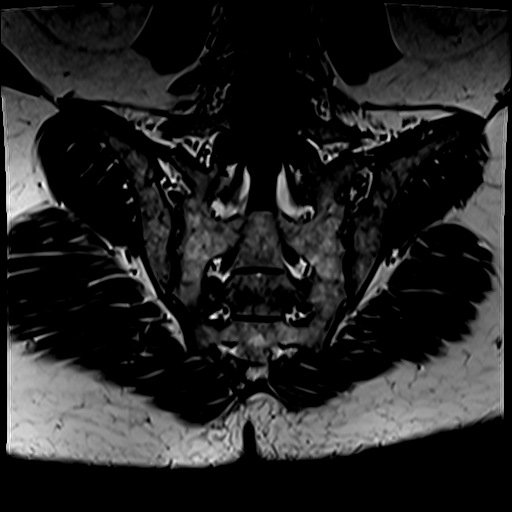
[im 22/25]
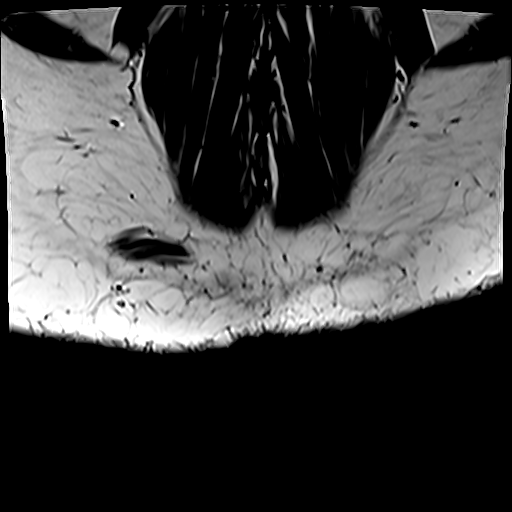

[24 of 48 positions shown; findings below may reference images not displayed]

FINDINGS: Bones: The sacrum and coccyx and sacroiliac joints and adjacent
iliac bones appear normal. Chronic degenerative disc disease at L4-5
and L5-S1 with chronic grade 1 spondylolisthesis at L4-5 with spinal
stenosis at L4-5.

Spinal canal: There are benign Tarlov cysts at the S2 and S3 levels
of the sacrum. Spinal stenosis at L4-5.

Soft tissues: No adenopathy. Marked enlargement of the median lobe
of the prostate gland. No other significant abnormalities.
IMPRESSION: 1. Normal sacrum and coccyx.
2. Degenerative disc disease at L4-5 and L5-S1 with spinal stenosis
at L4-5.
3. Marked enlargement of the median lobe of the prostate gland.

## 2021-04-08 ENCOUNTER — Other Ambulatory Visit: Payer: BLUE CROSS/BLUE SHIELD

## 2021-04-09 ENCOUNTER — Other Ambulatory Visit: Payer: BLUE CROSS/BLUE SHIELD

## 2021-04-09 ENCOUNTER — Other Ambulatory Visit: Payer: Self-pay

## 2021-04-09 DIAGNOSIS — N401 Enlarged prostate with lower urinary tract symptoms: Secondary | ICD-10-CM

## 2021-04-10 ENCOUNTER — Telehealth: Payer: Self-pay

## 2021-04-10 LAB — PSA: Prostate Specific Ag, Serum: 3.3 ng/mL (ref 0.0–4.0)

## 2021-04-10 NOTE — Telephone Encounter (Signed)
-----   Message from Hollice Espy, MD sent at 04/10/2021  8:04 AM EST ----- ?PSA is going the right direction, now 3.3.  Lets check again as planned at your next follow-up. ? ?Hollice Espy, MD ? ?

## 2021-04-10 NOTE — Telephone Encounter (Signed)
Pt aware and will keep appointment as scheduled. ?

## 2021-06-03 ENCOUNTER — Ambulatory Visit: Payer: BLUE CROSS/BLUE SHIELD | Admitting: Dermatology

## 2021-06-03 DIAGNOSIS — L820 Inflamed seborrheic keratosis: Secondary | ICD-10-CM

## 2021-06-03 DIAGNOSIS — L821 Other seborrheic keratosis: Secondary | ICD-10-CM

## 2021-06-03 DIAGNOSIS — Z1283 Encounter for screening for malignant neoplasm of skin: Secondary | ICD-10-CM | POA: Diagnosis not present

## 2021-06-03 DIAGNOSIS — D229 Melanocytic nevi, unspecified: Secondary | ICD-10-CM

## 2021-06-03 DIAGNOSIS — L814 Other melanin hyperpigmentation: Secondary | ICD-10-CM | POA: Diagnosis not present

## 2021-06-03 DIAGNOSIS — D1800 Hemangioma unspecified site: Secondary | ICD-10-CM

## 2021-06-03 DIAGNOSIS — L578 Other skin changes due to chronic exposure to nonionizing radiation: Secondary | ICD-10-CM

## 2021-06-03 NOTE — Patient Instructions (Signed)

## 2021-06-03 NOTE — Progress Notes (Signed)
? ?  New Patient Visit ? ?Subjective  ?Jason Bowers is a 61 y.o. male who presents for the following: Other (Spot on his chest x 25 years that has recently started getting bigger.). ?The patient presents for Upper Body Skin Exam (UBSE) for skin cancer screening and mole check.  The patient has spots, moles and lesions to be evaluated, some may be new or changing and the patient has concerns that these could be cancer. ? ?The following portions of the chart were reviewed this encounter and updated as appropriate:  ? Tobacco  Allergies  Meds  Problems  Med Hx  Surg Hx  Fam Hx   ?  ?Review of Systems:  No other skin or systemic complaints except as noted in HPI or Assessment and Plan. ? ?Objective  ?Well appearing patient in no apparent distress; mood and affect are within normal limits. ? ?All skin waist up examined. ? ?Left medial pectoral ?Erythematous stuck-on, waxy papule or plaque ? ? ? ?Assessment & Plan  ? ?Lentigines ?- Scattered tan macules ?- Due to sun exposure ?- Benign-appearing, observe ?- Recommend daily broad spectrum sunscreen SPF 30+ to sun-exposed areas, reapply every 2 hours as needed. ?- Call for any changes ? ?Seborrheic Keratoses ?- Stuck-on, waxy, tan-brown papules and/or plaques  ?- Benign-appearing ?- Discussed benign etiology and prognosis. ?- Observe ?- Call for any changes ? ?Melanocytic Nevi ?- Tan-brown and/or pink-flesh-colored symmetric macules and papules ?- Benign appearing on exam today ?- Observation ?- Call clinic for new or changing moles ?- Recommend daily use of broad spectrum spf 30+ sunscreen to sun-exposed areas.  ? ?Hemangiomas ?- Red papules ?- Discussed benign nature ?- Observe ?- Call for any changes ? ?Actinic Damage ?- Chronic condition, secondary to cumulative UV/sun exposure ?- diffuse scaly erythematous macules with underlying dyspigmentation ?- Recommend daily broad spectrum sunscreen SPF 30+ to sun-exposed areas, reapply every 2 hours as needed.  ?- Staying  in the shade or wearing long sleeves, sun glasses (UVA+UVB protection) and wide brim hats (4-inch brim around the entire circumference of the hat) are also recommended for sun protection.  ?- Call for new or changing lesions. ? ?Skin cancer screening performed today. ? ?Inflamed seborrheic keratosis ?Left medial pectoral ? ?RTC if not resolved after 6 weeks ? ?Destruction of lesion - Left medial pectoral ?Complexity: simple   ?Destruction method: cryotherapy   ?Informed consent: discussed and consent obtained   ?Timeout:  patient name, date of birth, surgical site, and procedure verified ?Lesion destroyed using liquid nitrogen: Yes   ?Region frozen until ice ball extended beyond lesion: Yes   ?Outcome: patient tolerated procedure well with no complications   ?Post-procedure details: wound care instructions given   ? ? ?Return if symptoms worsen or fail to improve. ? ?I, Ashok Cordia, CMA, am acting as scribe for Sarina Ser, MD . ?Documentation: I have reviewed the above documentation for accuracy and completeness, and I agree with the above. ? ?Sarina Ser, MD ? ? ?

## 2021-06-11 ENCOUNTER — Encounter: Payer: Self-pay | Admitting: Dermatology

## 2021-06-12 ENCOUNTER — Encounter: Payer: Self-pay | Admitting: Urology

## 2021-06-12 MED ORDER — TAMSULOSIN HCL 0.4 MG PO CAPS: 0.4000 mg | ORAL_CAPSULE | Freq: Every day | ORAL | 11 refills | Status: DC

## 2022-01-01 ENCOUNTER — Other Ambulatory Visit: Payer: Self-pay | Admitting: *Deleted

## 2022-01-01 DIAGNOSIS — N401 Enlarged prostate with lower urinary tract symptoms: Secondary | ICD-10-CM

## 2022-01-09 ENCOUNTER — Other Ambulatory Visit: Payer: BLUE CROSS/BLUE SHIELD

## 2022-01-09 DIAGNOSIS — N401 Enlarged prostate with lower urinary tract symptoms: Secondary | ICD-10-CM

## 2022-01-10 LAB — PSA: Prostate Specific Ag, Serum: 4.3 ng/mL — ABNORMAL HIGH (ref 0.0–4.0)

## 2022-01-15 ENCOUNTER — Ambulatory Visit (INDEPENDENT_AMBULATORY_CARE_PROVIDER_SITE_OTHER): Payer: BLUE CROSS/BLUE SHIELD | Admitting: Urology

## 2022-01-15 ENCOUNTER — Encounter: Payer: Self-pay | Admitting: Urology

## 2022-01-15 VITALS — BP 146/86 | HR 66 | Ht 67.0 in | Wt 186.0 lb

## 2022-01-15 DIAGNOSIS — N401 Enlarged prostate with lower urinary tract symptoms: Secondary | ICD-10-CM | POA: Diagnosis not present

## 2022-01-15 DIAGNOSIS — R3912 Poor urinary stream: Secondary | ICD-10-CM | POA: Diagnosis not present

## 2022-01-15 DIAGNOSIS — R972 Elevated prostate specific antigen [PSA]: Secondary | ICD-10-CM | POA: Diagnosis not present

## 2022-01-15 DIAGNOSIS — R339 Retention of urine, unspecified: Secondary | ICD-10-CM | POA: Diagnosis not present

## 2022-01-15 LAB — BLADDER SCAN AMB NON-IMAGING: Scan Result: 67

## 2022-01-15 MED ORDER — TAMSULOSIN HCL 0.4 MG PO CAPS: 0.4000 mg | ORAL_CAPSULE | Freq: Every day | ORAL | 3 refills | Status: DC

## 2022-01-16 NOTE — Progress Notes (Signed)
01/15/2022 5:58 PM   Jason Bowers 1960-06-22 [id number]  Referring provider: Dion Body, MD [facility] [address],  [place] [address]  Chief Complaint  Patient presents with   Benign Prostatic Hypertrophy    HPI: 61 year old male who presents today for routine history of BPH which.  Today, he feels like he is doing pretty well.  Initially with elevated PVR to 250 range for PVR.  He brachy went well.  He is on Flomax, doing well with urinary symptoms outline below.    He also has a history of elevated/fluctuating PSA.  PSA trend as below.  PSA continues to rise, most recently 4 3 on 11/09/2021.     Prostate Specific Ag, Serum  Latest Ref Rng 0.0 - 4.0 ng/mL  07/03/2020 3.0   01/07/2021 3.8   04/09/2021 3.3   01/09/2022 4.3 (H)     Legend: (H) High   Results for orders placed or performed in visit on 01/15/22  Bladder Scan (Post Void Residual) in office  Result Value Ref Range   Scan Result 67 ml       IPSS     Row Name 01/15/22 1100         International Prostate Symptom Score   How often have you had the sensation of not emptying your bladder? Less than 1 in 5     How often have you had to urinate less than every two hours? Not at All     How often have you found you stopped and started again several times when you urinated? Not at All     How often have you found it difficult to postpone urination? Less than 1 in 5 times     How often have you had a weak urinary stream? Less than half the time     How often have you had to strain to start urination? Not at All     How many times did you typically get up at night to urinate? 1 Time     Total IPSS Score 5       Quality of Life due to urinary symptoms   If you were to spend the rest of your life with your urinary condition just the way it is now how would you feel about that? 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 If you have any questions, ask your nurse or doctor.          acetaminophen 325 MG tablet Commonly known as: TYLENOL Take 650 mg every 6 (six) hours as needed by mouth.   gabapentin 300 MG capsule Commonly known as: NEURONTIN   omeprazole 20 MG tablet Commonly known as: PRILOSEC OTC Take by mouth.   tamsulosin 0.4 MG Caps capsule Commonly  known as: FLOMAX Take 1 capsule (0.4 mg total) by mouth daily.        Allergies: No Known Allergies  Family History: Family History  Problem Relation Age of Onset   Bladder Cancer Mother    Kidney cancer Neg Hx    Prostate cancer Neg Hx     Social History:  reports that he has quit smoking. His smoking use included cigarettes. He has never used smokeless tobacco. He reports that he does not use drugs. No history on file for alcohol use.   Physical Exam: BP (!) 146/86   Pulse 66   Ht '5\' 7"'$  (1.702 m)   Wt 186 lb (84.4 kg)   BMI 29.13 kg/m   Constitutional:  Alert and oriented, No acute distress. HEENT: [place] AT, moist mucus membranes.  Trachea midline, no masses. Cardiovascular: No clubbing, cyanosis, or edema. Respiratory: Normal respiratory effort, no increased work of breathing. Rectal: 50 cc Prostate with rubbery lateral lobes.  Normal sphincter tone. Neurologic: Grossly intact, no focal deficits, moving all 4 extremities. Psychiatric: Normal mood and affect.    Assessment & Plan:    1. Benign prostatic  hyperplasia with weak urinary stream  Symptoms well-controlled on Flomax  If interested in other treatment options, recommend cysto/ TRUS  Recent further - Bladder Scan (Post Void Residual) in office  2. Elevated PSA History of elevated fluctuating PSA  PSA is trending upward.   Rectal exam is reassuring.  Will plan to repeat PSA in 6 months, if it continues to trend upwards, will consider biopsy versus MRI.  If trending back down, follow-up with PSA/ rectal in 1 year  3. Incomplete bladder emptying Improved   PSA only in 6 mo, PSA/ DRE/ IPSS/ PVR in 1 year  Hollice Espy, MD  [facility] [address], [place], [place] [address] [phone]

## 2022-04-03 ENCOUNTER — Other Ambulatory Visit: Payer: Self-pay | Admitting: Family Medicine

## 2022-04-03 DIAGNOSIS — E7800 Pure hypercholesterolemia, unspecified: Secondary | ICD-10-CM

## 2022-04-03 DIAGNOSIS — Z9189 Other specified personal risk factors, not elsewhere classified: Secondary | ICD-10-CM

## 2022-04-22 ENCOUNTER — Ambulatory Visit
Admission: RE | Admit: 2022-04-22 | Discharge: 2022-04-22 | Disposition: A | Discharge status: Home / Self Care | Payer: Self-pay | Source: Ambulatory Visit | Attending: Family Medicine | Admitting: Family Medicine

## 2022-04-22 DIAGNOSIS — Z9189 Other specified personal risk factors, not elsewhere classified: Secondary | ICD-10-CM

## 2022-04-22 DIAGNOSIS — E7800 Pure hypercholesterolemia, unspecified: Secondary | ICD-10-CM

## 2022-05-30 ENCOUNTER — Ambulatory Visit: Payer: Commercial Managed Care - HMO

## 2022-05-30 DIAGNOSIS — K5730 Diverticulosis of large intestine without perforation or abscess without bleeding: Secondary | ICD-10-CM | POA: Diagnosis not present

## 2022-05-30 DIAGNOSIS — K640 First degree hemorrhoids: Secondary | ICD-10-CM | POA: Diagnosis not present

## 2022-05-30 DIAGNOSIS — Z1211 Encounter for screening for malignant neoplasm of colon: Secondary | ICD-10-CM

## 2022-07-07 ENCOUNTER — Other Ambulatory Visit: Payer: Self-pay | Admitting: Family Medicine

## 2022-07-07 DIAGNOSIS — R972 Elevated prostate specific antigen [PSA]: Secondary | ICD-10-CM

## 2022-07-17 ENCOUNTER — Other Ambulatory Visit: Payer: Commercial Managed Care - PPO

## 2022-07-17 DIAGNOSIS — R972 Elevated prostate specific antigen [PSA]: Secondary | ICD-10-CM

## 2022-07-18 LAB — PSA: Prostate Specific Ag, Serum: 6 ng/mL — ABNORMAL HIGH (ref 0.0–4.0)

## 2022-07-21 ENCOUNTER — Other Ambulatory Visit: Payer: Self-pay

## 2022-07-21 DIAGNOSIS — R972 Elevated prostate specific antigen [PSA]: Secondary | ICD-10-CM

## 2022-08-15 NOTE — Addendum Note (Signed)
Addended by: Consuella Lose on: 08/15/2022 09:20 AM   Modules accepted: Orders

## 2022-08-19 NOTE — Telephone Encounter (Signed)
FYI to Dr Apolinar Junes.

## 2022-08-27 ENCOUNTER — Ambulatory Visit
Admission: RE | Admit: 2022-08-27 | Discharge: 2022-08-27 | Disposition: A | Discharge status: Home / Self Care | Payer: Self-pay | Source: Ambulatory Visit | Attending: Urology | Admitting: Urology

## 2022-08-27 DIAGNOSIS — R972 Elevated prostate specific antigen [PSA]: Secondary | ICD-10-CM | POA: Insufficient documentation

## 2022-08-27 MED ORDER — GADOBUTROL 1 MMOL/ML IV SOLN: 7.5000 mL | Freq: Once | INTRAVENOUS | Status: DC | PRN

## 2022-08-28 MED ORDER — GADOBUTROL 1 MMOL/ML IV SOLN
7.5000 mL | Freq: Once | INTRAVENOUS | Status: AC | PRN
  Administered 2022-08-28: 7.5 mL via INTRAVENOUS

## 2023-01-19 ENCOUNTER — Other Ambulatory Visit: Payer: Self-pay

## 2023-01-19 DIAGNOSIS — N401 Enlarged prostate with lower urinary tract symptoms: Secondary | ICD-10-CM

## 2023-01-19 DIAGNOSIS — R972 Elevated prostate specific antigen [PSA]: Secondary | ICD-10-CM

## 2023-01-20 ENCOUNTER — Other Ambulatory Visit: Payer: BC Managed Care – PPO

## 2023-01-20 DIAGNOSIS — R972 Elevated prostate specific antigen [PSA]: Secondary | ICD-10-CM

## 2023-01-20 DIAGNOSIS — N401 Enlarged prostate with lower urinary tract symptoms: Secondary | ICD-10-CM

## 2023-01-21 ENCOUNTER — Ambulatory Visit: Payer: BC Managed Care – PPO | Admitting: Urology

## 2023-01-21 VITALS — BP 121/80 | HR 80 | Ht 67.0 in | Wt 193.0 lb

## 2023-01-21 DIAGNOSIS — R3912 Poor urinary stream: Secondary | ICD-10-CM

## 2023-01-21 DIAGNOSIS — R972 Elevated prostate specific antigen [PSA]: Secondary | ICD-10-CM | POA: Diagnosis not present

## 2023-01-21 DIAGNOSIS — N401 Enlarged prostate with lower urinary tract symptoms: Secondary | ICD-10-CM

## 2023-01-21 DIAGNOSIS — R339 Retention of urine, unspecified: Secondary | ICD-10-CM

## 2023-01-21 LAB — BLADDER SCAN AMB NON-IMAGING

## 2023-01-21 LAB — PSA: Prostate Specific Ag, Serum: 4 ng/mL (ref 0.0–4.0)

## 2023-01-21 NOTE — Progress Notes (Signed)
Marcelle Overlie Plume,acting as a scribe for Vanna Scotland, MD.,have documented all relevant documentation on the behalf of Vanna Scotland, MD,as directed by  Vanna Scotland, MD while in the presence of Vanna Scotland, MD.  01/21/23 3:55 PM   Jason Bowers [birth year] [id number]  Referring provider: Marisue Ivan, MD [phone] [facility] [address] [facility] [place],  [place] [address]  Chief Complaint  Patient presents with   Benign Prostatic Hypertrophy    HPI: 62 year-old male who presents today for a 1 year follow up for multiple GU issues including a personal history of BPH.   He was last seen a year ago.   He has also had a prostate MRI in 08/2022 which showed a 49 cc prostate, PI-RADS 1, with low suspicion for prostate cancer.   He initially presented with elevated PVR's, which have been doing better. His PVR today is 75 mL.  He also has a personal history of elevated PSA. His most recent PSA on 01/20/2023 was 4.0. It has been as high as 6 on 07/17/2022 and is trending back downwards to his baseline. His PSA still remains elevated but he has some history of fluctuations.   He is currently on Flomax for his urinary symptoms. He reports that his emptying has improved. He was previously not interested in an outlet procedure.     Prostate Specific Ag, Serum  Latest Ref Rng 0.0 - 4.0 ng/mL  07/03/2020 3.0   01/07/2021 3.8   04/09/2021 3.3   01/09/2022 4.3 (H)   07/17/2022 6.0 (H)   01/20/2023 4.0      Results for orders placed or performed in visit on 01/21/23  Bladder Scan (Post Void Residual) in office  Result Value Ref Range   Scan Result 75ml     IPSS     Row Name 01/21/23 1500         International Prostate Symptom Score   How often have you had the sensation of not emptying your bladder? Less than 1 in 5     How often have you had to urinate less than every two hours? Less than 1 in 5 times     How often have you found you stopped and started again several  times when you urinated? Less than 1 in 5 times     How often have you found it difficult to postpone urination? Less than 1 in 5 times     How often have you had a weak urinary stream? More than half the time     How often have you had to strain to start urination? Less than 1 in 5 times     How many times did you typically get up at night to urinate? 2 Times     Total IPSS Score 11       Quality of Life due to urinary symptoms   If you were to spend the rest of your life with your urinary condition just the way it is now how would you feel about that? 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 If you have any questions, ask your nurse or doctor.          STOP taking these medications    omeprazole 20 MG tablet Commonly known as: PRILOSEC OTC       TAKE these medications    acetaminophen 325 MG tablet Commonly known as: TYLENOL Take 650 mg every 6 (six) hours as needed by mouth.   aspirin EC 81 MG tablet Take 81 mg by mouth daily.   gabapentin 300 MG capsule Commonly known as: NEURONTIN Take 1 capsule by mouth 2 (two) times daily. What changed: Another medication with the same name was removed. Continue taking this medication, and follow the directions you see here.   pantoprazole 20 MG tablet Commonly known as: PROTONIX Take 20 mg by mouth daily.   rosuvastatin 5 MG tablet Commonly known as:  CRESTOR Take 1 tablet by mouth at bedtime.   tamsulosin 0.4 MG Caps capsule Commonly known as: FLOMAX Take 1 capsule (0.4 mg total) by mouth daily.        Allergies: No Known Allergies  Family History: Family History  Problem Relation Age of Onset   Bladder Cancer Mother    Kidney cancer Neg Hx    Prostate cancer Neg Hx     Social History:  reports that he has quit smoking. His smoking use included cigarettes. He has never used smokeless tobacco. He reports that he does not use drugs. No history on file for alcohol use.   Physical Exam: BP 121/80 (BP Location: Left Arm, Patient Position: Sitting, Cuff Size: Normal)   Pulse 80   Ht 5\' 7"  (1.702 m)   Wt 193 lb (87.5 kg)   BMI 30.23 kg/m   Constitutional:  Alert and oriented, No acute distress. HEENT: [place] AT, moist mucus membranes.  Trachea midline, no masses. Rectal: Moderately enlarged prostate, some rubbery nodularity bilaterally Neurologic: Grossly intact, no focal deficits, moving all 4 extremities. Psychiatric: Normal mood and affect.   Assessment & Plan:    1. BPH - Reports improved urinary symptoms with tamsulosin, though he still experiences a weak urinary stream.  - PVR today is 75 mL, indicating improvement. - Continue tamsulosin as it is well-tolerated and effective for the patient. - He is not interested in surgical intervention at this time.  2. Elevated PSA - PSA level is currently 4.0, down from a previous high of 6.0 six months ago. MRI of the prostate shows a 49cc prostate with low suspicion for cancer (PI-RADS 1). - No indication for prostate biopsy at this time due to reassuring MRI findings and downward PSA trend. - Continue annual monitoring of PSA levels.   Return in about 1 year (around 01/21/2024) for IPSS, PVR, PSA, DRE.  I have reviewed the above documentation for accuracy and completeness, and I agree with the above.   Vanna Scotland, MD    [facility] [address] [place], [place] [address] [phone]

## 2023-02-03 ENCOUNTER — Other Ambulatory Visit: Payer: Self-pay | Admitting: Family Medicine

## 2023-02-03 DIAGNOSIS — M25562 Pain in left knee: Secondary | ICD-10-CM

## 2023-02-14 ENCOUNTER — Ambulatory Visit
Admission: RE | Admit: 2023-02-14 | Discharge: 2023-02-14 | Disposition: A | Discharge status: Home / Self Care | Payer: BC Managed Care – PPO | Source: Ambulatory Visit | Attending: Family Medicine | Admitting: Family Medicine

## 2023-02-14 DIAGNOSIS — M25562 Pain in left knee: Secondary | ICD-10-CM

## 2023-02-16 ENCOUNTER — Encounter: Payer: Self-pay | Admitting: Urology

## 2023-02-17 MED ORDER — TAMSULOSIN HCL 0.4 MG PO CAPS: 0.4000 mg | ORAL_CAPSULE | Freq: Every day | ORAL | 3 refills | Status: DC

## 2023-03-16 ENCOUNTER — Other Ambulatory Visit: Payer: Self-pay | Admitting: Urology

## 2023-05-28 ENCOUNTER — Encounter: Payer: Self-pay | Admitting: Urology

## 2023-06-02 MED ORDER — TAMSULOSIN HCL 0.4 MG PO CAPS: 0.4000 mg | ORAL_CAPSULE | Freq: Every day | ORAL | 3 refills | Status: AC

## 2023-09-29 ENCOUNTER — Encounter: Payer: Self-pay | Admitting: Urology

## 2024-02-03 ENCOUNTER — Other Ambulatory Visit: Payer: Self-pay

## 2024-02-03 DIAGNOSIS — R972 Elevated prostate specific antigen [PSA]: Secondary | ICD-10-CM

## 2024-02-05 ENCOUNTER — Other Ambulatory Visit: Payer: Self-pay

## 2024-02-08 ENCOUNTER — Other Ambulatory Visit

## 2024-02-08 DIAGNOSIS — R972 Elevated prostate specific antigen [PSA]: Secondary | ICD-10-CM

## 2024-02-09 ENCOUNTER — Ambulatory Visit: Payer: Self-pay | Admitting: Urology

## 2024-02-09 DIAGNOSIS — N400 Enlarged prostate without lower urinary tract symptoms: Secondary | ICD-10-CM | POA: Insufficient documentation

## 2024-02-09 DIAGNOSIS — R972 Elevated prostate specific antigen [PSA]: Secondary | ICD-10-CM | POA: Insufficient documentation

## 2024-02-09 LAB — PSA: Prostate Specific Ag, Serum: 4.7 ng/mL — ABNORMAL HIGH (ref 0.0–4.0)

## 2024-02-09 NOTE — Assessment & Plan Note (Signed)
 PSA 4.8 (02/08/24)  - baseline 3-6 (since 2022)   - negative prostate MRI (2024)   Reviewed his clinical history and PSA screening data.  Slight increase in most recent PSA although well within historic baseline over the last couple years.  Recommend continuation of annual routine PSA screening.   - continue routine annual PSA screening, next due ~Jan 2027

## 2024-02-09 NOTE — Assessment & Plan Note (Addendum)
 On Flomax   - ~50g gland   - PVR today  Today we reviewed the physiology and common causes of male lower urinary tract symptoms (LUTS). Discussed potential etiologies including infectious, inflammatory, bladder-related, benign prostatic hyperplasia (BPH), and musculoskeletal/pelvic floor contributions. Reviewed the standard diagnostic workup (urinalysis, PVR, uroflow, prostate assessment, possible cystoscopy or imaging) and the spectrum of initial management strategies ranging from behavioral and lifestyle measures to pharmacologic therapy, with procedural options if indicated. All questions were addressed and the patient expressed understanding of the evaluation and treatment pathway.   - Continue Flomax  monotherapy for now. -Will consider combo therapy with finasteride if symptoms progress -Reviewed PVR-he did not think this was representative of normal voiding.  Discussed bladder hygiene, complete emptying, double voiding if needed

## 2024-02-09 NOTE — Progress Notes (Signed)
" ° °  02/11/2024 3:49 PM   Jason Bowers 1960/07/26 [id number]  Reason for visit: Follow up BPH, elevated PSA   HPI: 63 y.o. male, initial follow up with me today, previously seen by Dr. Penne in Dec 2024  First meeting today, very pleasant gentleman Overall doing well, no major interval issues IPSS 10/2 -quite content with urinary habits, continues on Flomax  monotherapy Most recent PSA 4.8 (02/08/2024)  Prior HPI: Hx of elevated PSA  - PSA 6.0 (Jun 2024)  - PSA 4.0 (Dec 2024)  - MRI prostate (2024) - 49g gland, negative lesions  Hx of BPH  - on Flomax   - prior PVR ~75cc    Physical Exam: BP (!) 165/100   Pulse 90   Ht 5' 7 (1.702 m)   Wt 180 lb (81.6 kg)   SpO2 97%   BMI 28.19 kg/m    Constitutional:  Alert and oriented, No acute distress.  Laboratory Data:          Component Ref Range & Units (hover) 1 d ago (02/08/24) 1 yr ago (01/20/23) 1 yr ago (07/17/22) 2 yr ago (01/09/22) 2 yr ago (04/09/21) 3 yr ago (01/07/21) 3 yr ago (07/03/20)  Prostate Specific Ag, Serum 4.7 High  4.0 CM 6.0 High  CM 4.3 High  CM 3.3 CM 3.8 CM 3.0 CM    Pertinent Imaging: N/A    Assessment & Plan:    Elevated PSA Assessment & Plan: PSA 4.8 (02/08/24)  - baseline 3-6 (since 2022)   - negative prostate MRI (2024)   Reviewed his clinical history and PSA screening data.  Slight increase in most recent PSA although well within historic baseline over the last couple years.  Recommend continuation of annual routine PSA screening.   - continue routine annual PSA screening, next due ~Jan 2027   Benign prostatic hyperplasia with lower urinary tract symptoms, symptom details unspecified Assessment & Plan: On Flomax   - ~50g gland   - PVR today  Today we reviewed the physiology and common causes of male lower urinary tract symptoms (LUTS). Discussed potential etiologies including infectious, inflammatory, bladder-related, benign prostatic hyperplasia (BPH), and  musculoskeletal/pelvic floor contributions. Reviewed the standard diagnostic workup (urinalysis, PVR, uroflow, prostate assessment, possible cystoscopy or imaging) and the spectrum of initial management strategies ranging from behavioral and lifestyle measures to pharmacologic therapy, with procedural options if indicated. All questions were addressed and the patient expressed understanding of the evaluation and treatment pathway.   - Continue Flomax  monotherapy for now. -Will consider combo therapy with finasteride if symptoms progress -Reviewed PVR-he did not think this was representative of normal voiding.  Discussed bladder hygiene, complete emptying, double voiding if needed        Penne JONELLE Skye, MD  [facility] [address] [place], [place] [address] [phone] "

## 2024-02-11 ENCOUNTER — Ambulatory Visit (INDEPENDENT_AMBULATORY_CARE_PROVIDER_SITE_OTHER): Admitting: Urology

## 2024-02-11 VITALS — BP 165/100 | HR 90 | Ht 67.0 in | Wt 180.0 lb

## 2024-02-11 DIAGNOSIS — N401 Enlarged prostate with lower urinary tract symptoms: Secondary | ICD-10-CM

## 2024-02-11 DIAGNOSIS — R972 Elevated prostate specific antigen [PSA]: Secondary | ICD-10-CM | POA: Diagnosis not present

## 2024-02-11 LAB — BLADDER SCAN AMB NON-IMAGING: Scan Result: 136

## 2024-02-11 NOTE — Addendum Note (Signed)
 Addended byBETHA CORIE PLATER on: 02/11/2024 03:53 PM   Modules accepted: Orders

## 2025-02-09 ENCOUNTER — Other Ambulatory Visit

## 2025-02-16 ENCOUNTER — Ambulatory Visit: Admitting: Urology
